# Patient Record
Sex: Male | Born: 1995
Health system: Southern US, Community
[De-identification: ages and names within clinical notes are randomized; demographics above are authoritative.]

## PROBLEM LIST (undated history)

## (undated) DIAGNOSIS — F952 Tourette's disorder: Secondary | ICD-10-CM

## (undated) DIAGNOSIS — F988 Other specified behavioral and emotional disorders with onset usually occurring in childhood and adolescence: Secondary | ICD-10-CM

---
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
## 2016-01-11 MED FILL — guanFACINE HCL 1 MG TABS: 1 | 90 days supply | Qty: 360 | Fill #1

## 2016-01-26 MED FILL — lamoTRIgine 100 MG TABS: 100 | 90 days supply | Qty: 135 | Fill #1

## 2016-03-21 MED FILL — AMANTADINE 100 MG TABLET: 100 | 90 days supply | Qty: 180 | Fill #2

## 2016-03-24 MED FILL — HYDROCODON-APAP 10-325: 10-325 | 3 days supply | Qty: 15 | Fill #0

## 2016-03-24 MED FILL — AMOXICILLIN 500 MG CAPSULE: 500 | 7 days supply | Qty: 21 | Fill #0

## 2016-03-28 MED FILL — PIMOZIDE 2 MG TABLET: 2 | 90 days supply | Qty: 180 | Fill #2

## 2016-04-11 MED FILL — guanFACINE HCL 1 MG TABS: 1 | 90 days supply | Qty: 360 | Fill #2

## 2016-04-29 MED FILL — lamoTRIgine 100 MG TABS: 100 | 90 days supply | Qty: 135 | Fill #2

## 2016-05-16 MED FILL — DEXMETHYLPHENIDATE ER 15 MG: 15 | 90 days supply | Qty: 90 | Fill #0

## 2016-06-17 MED FILL — PIMOZIDE 2 MG TABLET: 2 | 90 days supply | Qty: 180 | Fill #0

## 2016-06-17 MED FILL — AMANTADINE 100 MG TABLET: 100 | 90 days supply | Qty: 180 | Fill #0

## 2016-07-12 MED FILL — guanFACINE HCL 1 MG TABS: 1 | 90 days supply | Qty: 360 | Fill #0

## 2016-07-28 DIAGNOSIS — R4681 Obsessive-compulsive behavior: Secondary | ICD-10-CM | POA: Diagnosis not present

## 2016-07-28 DIAGNOSIS — F329 Major depressive disorder, single episode, unspecified: Secondary | ICD-10-CM | POA: Diagnosis not present

## 2016-07-28 DIAGNOSIS — F900 Attention-deficit hyperactivity disorder, predominantly inattentive type: Secondary | ICD-10-CM | POA: Diagnosis not present

## 2016-07-28 MED FILL — lamoTRIgine 100 MG TABS: 100 | 90 days supply | Qty: 135 | Fill #0

## 2016-08-12 MED FILL — DEXMETHYLPHENIDATE ER 15 MG: 15 | 90 days supply | Qty: 90 | Fill #0

## 2016-09-20 MED FILL — AMANTADINE 100 MG TABLET: 100 | 90 days supply | Qty: 180 | Fill #0

## 2016-09-27 MED FILL — PIMOZIDE 2 MG TABLET: 2 | 90 days supply | Qty: 180 | Fill #0

## 2016-10-10 MED FILL — guanFACINE HCL 1 MG TABS: 1 | 90 days supply | Qty: 360 | Fill #0

## 2016-10-24 MED FILL — lamoTRIgine 100 MG TABS: 100 | 90 days supply | Qty: 135 | Fill #1

## 2016-11-08 MED FILL — DEXMETHYLPHENIDATE ER 15 MG: 15 | 90 days supply | Qty: 90 | Fill #0

## 2016-12-19 MED FILL — AMANTADINE HCL 100 MG TAB: 100 | 90 days supply | Qty: 180 | Fill #1

## 2016-12-30 MED FILL — PIMOZIDE 2 MG TABLET: 2 | 90 days supply | Qty: 180 | Fill #1

## 2017-01-09 MED FILL — guanFACINE HCL 1 MG TABS: 1 | 90 days supply | Qty: 360 | Fill #1

## 2017-01-26 DIAGNOSIS — F329 Major depressive disorder, single episode, unspecified: Secondary | ICD-10-CM | POA: Diagnosis not present

## 2017-01-26 DIAGNOSIS — F902 Attention-deficit hyperactivity disorder, combined type: Secondary | ICD-10-CM | POA: Diagnosis not present

## 2017-01-26 DIAGNOSIS — R4681 Obsessive-compulsive behavior: Secondary | ICD-10-CM | POA: Diagnosis not present

## 2017-01-26 MED FILL — lamoTRIgine 100 MG TABS: 100 | 90 days supply | Qty: 135 | Fill #0

## 2017-02-09 MED FILL — DEXMETHYLPHENIDATE ER 15 MG: 15 | 90 days supply | Qty: 90 | Fill #0

## 2017-03-17 MED FILL — AMANTADINE HCL 100 MG TAB: 100 | 90 days supply | Qty: 180 | Fill #0

## 2017-03-27 MED FILL — PIMOZIDE 2 MG TABLET: 2 | 90 days supply | Qty: 180 | Fill #0

## 2017-04-10 MED FILL — guanFACINE HCL 1 MG TABS: 1 | 90 days supply | Qty: 360 | Fill #0

## 2017-04-17 MED FILL — lamoTRIgine 100 MG TABS: 100 | 90 days supply | Qty: 135 | Fill #1

## 2017-05-11 MED FILL — DEXMETHYLPHENIDATE ER 15 MG: 15 | 90 days supply | Qty: 90 | Fill #0

## 2017-06-15 MED FILL — AMANTADINE HCL 100 MG TAB: 100 | 90 days supply | Qty: 180 | Fill #1

## 2017-06-26 MED FILL — PIMOZIDE 2 MG TABLET: 2 | 90 days supply | Qty: 180 | Fill #1

## 2017-07-10 MED FILL — guanFACINE HCL 1 MG TABS: 1 | 90 days supply | Qty: 360 | Fill #1

## 2017-07-24 MED FILL — lamoTRIgine 100 MG TABS: 100 | 90 days supply | Qty: 135 | Fill #0

## 2017-08-14 MED FILL — DEXMETHYLPHENIDATE ER 15 MG: 15 | 90 days supply | Qty: 90 | Fill #0

## 2017-08-25 DIAGNOSIS — F900 Attention-deficit hyperactivity disorder, predominantly inattentive type: Secondary | ICD-10-CM | POA: Diagnosis not present

## 2017-08-25 DIAGNOSIS — R4681 Obsessive-compulsive behavior: Secondary | ICD-10-CM | POA: Diagnosis not present

## 2017-08-25 DIAGNOSIS — F329 Major depressive disorder, single episode, unspecified: Secondary | ICD-10-CM | POA: Diagnosis not present

## 2017-09-01 MED FILL — DEXMETHYLPHENIDATE 10 MG TA: 10 | 90 days supply | Qty: 90 | Fill #0

## 2017-09-18 MED FILL — AMANTADINE HCL 100 MG TAB: 100 | 90 days supply | Qty: 180 | Fill #0

## 2017-09-26 MED FILL — PIMOZIDE 2 MG TABLET: 2 | 90 days supply | Qty: 180 | Fill #0

## 2017-10-09 MED FILL — guanFACINE HCL 1 MG TABS: 1 | 90 days supply | Qty: 360 | Fill #0

## 2017-10-23 MED FILL — lamoTRIgine 100 MG TABS: 100 | 90 days supply | Qty: 180 | Fill #0

## 2017-11-10 MED FILL — DEXMETHYLPHENIDATE ER 15 MG: 15 | 90 days supply | Qty: 90 | Fill #0

## 2017-12-18 MED FILL — AMANTADINE HCL 100 MG TAB: 100 | 90 days supply | Qty: 180 | Fill #1

## 2018-01-01 MED FILL — PIMOZIDE 2 MG TABLET: 2 | 90 days supply | Qty: 180 | Fill #1

## 2018-01-09 MED FILL — guanFACINE HCL 1 MG TABS: 1 | 90 days supply | Qty: 360 | Fill #1

## 2018-02-09 MED FILL — DEXMETHYLPHENIDATE ER 15 MG: 15 | 30 days supply | Qty: 30 | Fill #0

## 2018-02-19 MED FILL — lamoTRIgine 100 MG TABS: 100 | 90 days supply | Qty: 180 | Fill #1

## 2018-02-21 DIAGNOSIS — F329 Major depressive disorder, single episode, unspecified: Secondary | ICD-10-CM | POA: Diagnosis not present

## 2018-02-21 DIAGNOSIS — F900 Attention-deficit hyperactivity disorder, predominantly inattentive type: Secondary | ICD-10-CM | POA: Diagnosis not present

## 2018-02-21 DIAGNOSIS — R4681 Obsessive-compulsive behavior: Secondary | ICD-10-CM | POA: Diagnosis not present

## 2018-02-21 MED FILL — DEXMETHYLPHENIDATE 10 MG TA: 10 | 30 days supply | Qty: 30 | Fill #0

## 2018-03-12 MED FILL — DEXMETHYLPHENIDATE ER 15 MG: 15 | 90 days supply | Qty: 90 | Fill #0

## 2018-03-19 MED FILL — AMANTADINE HCL 100 MG TAB: 100 | 30 days supply | Qty: 60 | Fill #0

## 2018-04-02 MED FILL — PIMOZIDE 2 MG TABLET: 2 | 30 days supply | Qty: 60 | Fill #0

## 2018-04-06 MED FILL — guanFACINE HCL 1 MG TABS: 1 | 30 days supply | Qty: 120 | Fill #0

## 2018-04-23 MED FILL — AMANTADINE HCL 100 MG TAB: 100 | 30 days supply | Qty: 60 | Fill #1

## 2018-04-30 MED FILL — PIMOZIDE 2 MG TABLET: 2 | 30 days supply | Qty: 60 | Fill #1

## 2018-05-10 MED FILL — guanFACINE HCL 1 MG TABS: 1 | 30 days supply | Qty: 120 | Fill #1

## 2018-05-14 DIAGNOSIS — R4681 Obsessive-compulsive behavior: Secondary | ICD-10-CM | POA: Diagnosis not present

## 2018-05-14 DIAGNOSIS — F329 Major depressive disorder, single episode, unspecified: Secondary | ICD-10-CM | POA: Diagnosis not present

## 2018-05-14 DIAGNOSIS — F900 Attention-deficit hyperactivity disorder, predominantly inattentive type: Secondary | ICD-10-CM | POA: Diagnosis not present

## 2018-05-14 MED FILL — DEXMETHYLPHENIDATE 5 MG TAB: 5 | 30 days supply | Qty: 60 | Fill #0

## 2018-05-14 MED FILL — lamoTRIgine 100 MG TABS: 100 | 90 days supply | Qty: 180 | Fill #0

## 2018-05-21 MED FILL — AMANTADINE HCL 100 MG TAB: 100 | 21 days supply | Qty: 30 | Fill #0

## 2018-06-04 MED FILL — guanFACINE HCL 1 MG TABS: 1 | 30 days supply | Qty: 120 | Fill #2

## 2018-06-04 MED FILL — PIMOZIDE 2 MG TABLET: 2 | 30 days supply | Qty: 60 | Fill #2

## 2018-06-08 MED FILL — DEXMETHYLPHENIDATE ER 15 MG: 15 | 90 days supply | Qty: 90 | Fill #0

## 2018-06-25 MED FILL — AMANTADINE HCL 100 MG TAB: 100 | 30 days supply | Qty: 60 | Fill #2

## 2018-06-28 MED FILL — PIMOZIDE 2 MG TABLET: 2 | 90 days supply | Qty: 180 | Fill #0

## 2018-07-09 MED FILL — guanFACINE HCL 1 MG TABS: 1 | 90 days supply | Qty: 360 | Fill #0

## 2018-08-21 DIAGNOSIS — F900 Attention-deficit hyperactivity disorder, predominantly inattentive type: Secondary | ICD-10-CM | POA: Diagnosis not present

## 2018-08-21 DIAGNOSIS — F329 Major depressive disorder, single episode, unspecified: Secondary | ICD-10-CM | POA: Diagnosis not present

## 2018-08-21 DIAGNOSIS — R4681 Obsessive-compulsive behavior: Secondary | ICD-10-CM | POA: Diagnosis not present

## 2018-09-05 MED FILL — AMANTADINE HCL 100 MG TAB: 100 | 45 days supply | Qty: 45 | Fill #0

## 2018-09-05 MED FILL — DEXMETHYLPHENIDATE ER 15 MG: 15 | 90 days supply | Qty: 90 | Fill #0

## 2018-10-01 MED FILL — PIMOZIDE 2 MG TABLET: 2 | 90 days supply | Qty: 180 | Fill #0

## 2018-10-08 MED FILL — guanFACINE HCL 1 MG TABS: 1 | 90 days supply | Qty: 360 | Fill #0

## 2018-10-15 MED FILL — lamoTRIgine 100 MG TABS: 100 | 90 days supply | Qty: 135 | Fill #0

## 2018-10-30 MED FILL — AMANTADINE HCL 100 MG TAB: 100 | 45 days supply | Qty: 45 | Fill #0

## 2018-11-19 DIAGNOSIS — F329 Major depressive disorder, single episode, unspecified: Secondary | ICD-10-CM | POA: Diagnosis not present

## 2018-11-19 DIAGNOSIS — R4681 Obsessive-compulsive behavior: Secondary | ICD-10-CM | POA: Diagnosis not present

## 2018-11-19 DIAGNOSIS — F900 Attention-deficit hyperactivity disorder, predominantly inattentive type: Secondary | ICD-10-CM | POA: Diagnosis not present

## 2018-11-20 MED FILL — DEXMETHYLPHENIDATE 5 MG TAB: 5 | 60 days supply | Qty: 120 | Fill #0

## 2018-12-05 MED FILL — DEXMETHYLPHENIDATE ER 15 MG: 15 | 90 days supply | Qty: 90 | Fill #0

## 2018-12-10 MED FILL — AMANTADINE HCL 100 MG TAB: 100 | 90 days supply | Qty: 90 | Fill #0

## 2018-12-18 DIAGNOSIS — M20012 Mallet finger of left finger(s): Secondary | ICD-10-CM | POA: Diagnosis not present

## 2018-12-18 DIAGNOSIS — M79645 Pain in left finger(s): Secondary | ICD-10-CM | POA: Diagnosis not present

## 2018-12-18 DIAGNOSIS — M25642 Stiffness of left hand, not elsewhere classified: Secondary | ICD-10-CM | POA: Diagnosis not present

## 2018-12-25 DIAGNOSIS — M20012 Mallet finger of left finger(s): Secondary | ICD-10-CM | POA: Diagnosis not present

## 2018-12-31 MED FILL — PIMOZIDE 2 MG TABLET: 2 | 90 days supply | Qty: 180 | Fill #0

## 2019-01-07 MED FILL — guanFACINE HCL 1 MG TABS: 1 | 90 days supply | Qty: 360 | Fill #0

## 2019-01-14 MED FILL — lamoTRIgine 100 MG TABS: 100 | 90 days supply | Qty: 135 | Fill #0

## 2019-02-12 DIAGNOSIS — M25642 Stiffness of left hand, not elsewhere classified: Secondary | ICD-10-CM | POA: Diagnosis not present

## 2019-02-12 DIAGNOSIS — M79645 Pain in left finger(s): Secondary | ICD-10-CM | POA: Diagnosis not present

## 2019-02-12 DIAGNOSIS — M20012 Mallet finger of left finger(s): Secondary | ICD-10-CM | POA: Diagnosis not present

## 2019-03-05 DIAGNOSIS — R4681 Obsessive-compulsive behavior: Secondary | ICD-10-CM | POA: Diagnosis not present

## 2019-03-05 DIAGNOSIS — F900 Attention-deficit hyperactivity disorder, predominantly inattentive type: Secondary | ICD-10-CM | POA: Diagnosis not present

## 2019-03-05 DIAGNOSIS — F329 Major depressive disorder, single episode, unspecified: Secondary | ICD-10-CM | POA: Diagnosis not present

## 2019-03-05 MED FILL — AMANTADINE HCL 100 MG TAB: 100 | 90 days supply | Qty: 90 | Fill #0

## 2019-03-06 MED FILL — DEXMETHYLPHENIDATE ER 15 MG: 15 | 90 days supply | Qty: 90 | Fill #0

## 2019-03-06 MED FILL — DEXMETHYLPHENIDATE 5 MG TAB: 5 | 60 days supply | Qty: 120 | Fill #0

## 2019-03-26 MED FILL — AMOXICILLIN 250 MG CAPSULE: 250 | 10 days supply | Qty: 40 | Fill #0

## 2019-04-03 MED FILL — PIMOZIDE 2 MG TABLET: 2 | 90 days supply | Qty: 180 | Fill #0

## 2019-04-10 MED FILL — guanFACINE HCL 1 MG TABS: 1 | 90 days supply | Qty: 360 | Fill #0

## 2019-04-18 MED FILL — lamoTRIgine 100 MG TABS: 100 | 90 days supply | Qty: 135 | Fill #0

## 2019-06-10 MED FILL — AMANTADINE HCL 100 MG TAB: 100 | 90 days supply | Qty: 90 | Fill #0

## 2019-06-11 MED FILL — DEXMETHYLPHENIDATE ER 15 MG: 15 | 90 days supply | Qty: 90 | Fill #0

## 2019-06-12 DIAGNOSIS — F329 Major depressive disorder, single episode, unspecified: Secondary | ICD-10-CM | POA: Diagnosis not present

## 2019-06-12 DIAGNOSIS — R4681 Obsessive-compulsive behavior: Secondary | ICD-10-CM | POA: Diagnosis not present

## 2019-06-12 DIAGNOSIS — F900 Attention-deficit hyperactivity disorder, predominantly inattentive type: Secondary | ICD-10-CM | POA: Diagnosis not present

## 2019-06-13 MED FILL — DEXMETHYLPHENIDATE 5 MG TAB: 5 | 60 days supply | Qty: 120 | Fill #0

## 2019-07-04 MED FILL — PIMOZIDE 2 MG TABLET: 2 | 90 days supply | Qty: 180 | Fill #0

## 2019-07-04 MED FILL — guanFACINE HCL 1 MG TABS: 1 | 90 days supply | Qty: 360 | Fill #0

## 2019-07-30 MED FILL — lamoTRIgine 100 MG TABS: 100 | 90 days supply | Qty: 135 | Fill #0

## 2019-09-06 MED FILL — AMANTADINE HCL 100 MG TAB: 100 | 90 days supply | Qty: 90 | Fill #0

## 2019-10-06 ENCOUNTER — Ambulatory Visit (HOSPITAL_COMMUNITY)
Admission: EM | Admit: 2019-10-06 | Discharge: 2019-10-06 | Disposition: A | Discharge status: Home / Self Care | Payer: 59 | Attending: Urgent Care | Admitting: Urgent Care

## 2019-10-06 ENCOUNTER — Other Ambulatory Visit: Payer: Self-pay

## 2019-10-06 ENCOUNTER — Encounter (HOSPITAL_COMMUNITY): Payer: Self-pay

## 2019-10-06 DIAGNOSIS — J358 Other chronic diseases of tonsils and adenoids: Secondary | ICD-10-CM | POA: Diagnosis not present

## 2019-10-06 DIAGNOSIS — J029 Acute pharyngitis, unspecified: Secondary | ICD-10-CM | POA: Insufficient documentation

## 2019-10-06 LAB — POCT RAPID STREP A: Streptococcus, Group A Screen (Direct): NEGATIVE

## 2019-10-06 MED ORDER — NAPROXEN 500 MG PO TABS: 500.0000 mg | ORAL_TABLET | Freq: Two times a day (BID) | ORAL | 0 refills | Status: AC

## 2019-10-06 MED ORDER — AMOXICILLIN 500 MG PO CAPS: 500.0000 mg | ORAL_CAPSULE | Freq: Two times a day (BID) | ORAL | 0 refills | Status: AC

## 2019-10-06 NOTE — ED Provider Notes (Signed)
[facility]   MRN: [id number] DOB: 01/04/1996  Subjective:   Stanley Williamson is a 24 y.o. male presenting for 3-day history of acute onset persistent and worsening throat pain with difficulty swallowing. Had COVID 19 testing about 10 days ago and was negative.   No current facility-administered medications for this encounter.  Current Outpatient Medications:  .  amantadine (SYMMETREL) 100 MG capsule, Take 1 capsule (100 mg total) by mouth 2 (two) times daily., Disp: 180 capsule, Rfl: 1 .  dexmethylphenidate (FOCALIN XR) 15 MG 24 hr capsule, Take 1 capsule (15 mg total) by mouth daily., Disp: 90 capsule, Rfl: 0 .  dexmethylphenidate (FOCALIN) 5 MG tablet, Take 1 tablet (5 mg total) by mouth 2 (two) times daily., Disp: 180 tablet, Rfl: 0 .  guanFACINE (TENEX) 1 MG tablet, Take one tablet qam and 2 qpm, Disp: 270 tablet, Rfl: 1 .  lamoTRIgine (LAMICTAL) 100 MG tablet, Take 100 mg by mouth in the morning and at bedtime., Disp: , Rfl:  .  pimozide (ORAP) 2 MG tablet, Take by mouth 2 (two) times daily. Take .5 tab qam and one tab qpm, Disp: , Rfl:  .  buPROPion (WELLBUTRIN XL) 300 MG 24 hr tablet, TAKE 1 TABLET BY MOUTH DAILY.  "OV NEEDED FOR ADDITIONAL REFILLS", Disp: 30 tablet, Rfl: 0 .  citalopram (CELEXA) 10 MG tablet, Take 1 tablet (10 mg total) by mouth daily., Disp: 30 tablet, Rfl: 1   No Known Allergies  Past Medical History:  Diagnosis Date  . Attention deficit disorder   . Tourette's      History reviewed. No pertinent surgical history.  Family History  Problem Relation Age of Onset  . Migraines Maternal Aunt   . Migraines Maternal Grandfather   . Dementia Paternal Grandmother   . Diabetes Other     Social History   Tobacco Use  . Smoking status: Never Smoker  . Smokeless tobacco: Never Used  Substance Use Topics  . Alcohol use: No  . Drug use: No    Review of Systems  Constitutional: Negative for fever and malaise/fatigue.  HENT: Positive for sore  throat. Negative for congestion, ear pain and sinus pain.   Eyes: Negative for discharge and redness.  Respiratory: Negative for cough, hemoptysis, shortness of breath and wheezing.   Cardiovascular: Negative for chest pain.  Gastrointestinal: Negative for abdominal pain, diarrhea, nausea and vomiting.  Genitourinary: Negative for dysuria, flank pain and hematuria.  Musculoskeletal: Negative for myalgias.  Skin: Negative for rash.  Neurological: Negative for dizziness, weakness and headaches.  Psychiatric/Behavioral: Negative for depression and substance abuse.     Objective:   Vitals: BP 119/80 (BP Location: Right Arm)   Pulse (!) 108 Comment: pt drank energy drink this am  Temp 98.2 F (36.8 C) (Oral)   Resp 16   SpO2 99%   Physical Exam Constitutional:      General: He is not in acute distress.    Appearance: Normal appearance. He is well-developed and normal weight. He is not ill-appearing, toxic-appearing or diaphoretic.  HENT:     Head: Normocephalic and atraumatic.     Right Ear: External ear normal.     Left Ear: External ear normal.     Nose: Nose normal.     Mouth/Throat:     Pharynx: Pharyngeal swelling, oropharyngeal exudate (R>L) and posterior oropharyngeal erythema present. No uvula swelling.     Tonsils: Tonsillar exudate present. No tonsillar abscesses. 2+ on the right. 1+ on the left.  Eyes:     General: No scleral icterus.       Right eye: No discharge.        Left eye: No discharge.     Extraocular Movements: Extraocular movements intact.     Pupils: Pupils are equal, round, and reactive to light.  Cardiovascular:     Rate and Rhythm: Normal rate.  Pulmonary:     Effort: Pulmonary effort is normal.  Musculoskeletal:     Cervical back: Normal range of motion.  Neurological:     Mental Status: He is alert and oriented to person, place, and time.  Psychiatric:        Mood and Affect: Mood normal.        Behavior: Behavior normal.        Thought  Content: Thought content normal.        Judgment: Judgment normal.     Results for orders placed or performed during the hospital encounter of 10/06/19 (from the past 24 hour(s))  POCT rapid strep A [facility])     Status: None   Collection Time: 10/06/19 10:59 AM  Result Value Ref Range   Streptococcus, Group A Screen (Direct) NEGATIVE NEGATIVE    Assessment and Plan :   PDMP not reviewed this encounter.  1. Pharyngitis, unspecified etiology   2. Tonsillar exudate     Will treat empirically for pharyngitis given physical exam findings.  Throat culture is pending.  Patient is to start amoxicillin, use supportive care otherwise. Counseled patient on potential for adverse effects with medications prescribed/recommended today, ER and return-to-clinic precautions discussed, patient verbalized understanding.    Jaynee Eagles, [place] 10/06/19 1151

## 2019-10-06 NOTE — ED Triage Notes (Signed)
See previous note in ED narrator.

## 2019-10-06 NOTE — ED Notes (Signed)
Pt presents today with sore throat x5 days. Pt denies fever, nausea, vomiting, congestion, runny nose, chills, loss of taste or smell. Pt states he has seasonal allergies, usually in spring time. Pt states he has been taking motrin with minimal relief, tylenol cold and flu with no relief.

## 2019-10-07 MED FILL — NAPROXEN 500 MG TABLET: 500 | 15 days supply | Qty: 30 | Fill #0

## 2019-10-07 MED FILL — AMOXICILLIN 500 MG CAPSULE: 500 | 10 days supply | Qty: 20 | Fill #0

## 2019-10-08 LAB — CULTURE, GROUP A STREP (THRC)

## 2019-10-09 DIAGNOSIS — F900 Attention-deficit hyperactivity disorder, predominantly inattentive type: Secondary | ICD-10-CM | POA: Diagnosis not present

## 2019-10-09 DIAGNOSIS — R4681 Obsessive-compulsive behavior: Secondary | ICD-10-CM | POA: Diagnosis not present

## 2019-10-09 DIAGNOSIS — F329 Major depressive disorder, single episode, unspecified: Secondary | ICD-10-CM | POA: Diagnosis not present

## 2019-10-09 MED FILL — PIMOZIDE 2 MG TABLET: 2 | 90 days supply | Qty: 180 | Fill #0

## 2019-10-09 MED FILL — guanFACINE HCL 1 MG TABS: 1 | 90 days supply | Qty: 360 | Fill #0

## 2019-10-10 MED FILL — DEXMETHYLPHENIDATE ER 15 MG: 15 | 90 days supply | Qty: 90 | Fill #0

## 2019-10-10 MED FILL — DEXMETHYLPHENIDATE 5 MG TAB: 5 | 60 days supply | Qty: 120 | Fill #0

## 2019-10-24 MED FILL — lamoTRIgine 100 MG TABS: 100 | 90 days supply | Qty: 135 | Fill #0

## 2019-10-30 DIAGNOSIS — R05 Cough: Secondary | ICD-10-CM | POA: Diagnosis not present

## 2019-10-31 DIAGNOSIS — Z20822 Contact with and (suspected) exposure to covid-19: Secondary | ICD-10-CM | POA: Diagnosis not present

## 2019-11-08 DIAGNOSIS — Z20822 Contact with and (suspected) exposure to covid-19: Secondary | ICD-10-CM | POA: Diagnosis not present

## 2019-11-08 DIAGNOSIS — Z03818 Encounter for observation for suspected exposure to other biological agents ruled out: Secondary | ICD-10-CM | POA: Diagnosis not present

## 2019-12-25 ENCOUNTER — Other Ambulatory Visit (HOSPITAL_COMMUNITY): Payer: Self-pay | Admitting: Psychiatric/Mental Health

## 2019-12-25 MED FILL — AMANTADINE HCL 100 MG TAB: 100 | 30 days supply | Qty: 30 | Fill #0

## 2020-01-03 MED FILL — guanFACINE HCL 1 MG TABS: 1 | 30 days supply | Qty: 120 | Fill #0

## 2020-01-03 MED FILL — PIMOZIDE 2 MG TABLET: 2 | 30 days supply | Qty: 60 | Fill #0

## 2020-01-06 MED FILL — DEXMETHYLPHENIDATE HCL ER 1: 15 | 30 days supply | Qty: 30 | Fill #0

## 2020-02-10 MED FILL — lamoTRIgine 100 MG TABS: 100 | 30 days supply | Qty: 45 | Fill #0

## 2020-02-10 MED FILL — guanFACINE HCL 1 MG TABS: 1 | 30 days supply | Qty: 120 | Fill #1

## 2020-02-10 MED FILL — PIMOZIDE 2 MG TABLET: 2 | 30 days supply | Qty: 60 | Fill #1

## 2020-02-10 MED FILL — DEXMETHYLPHENIDATE HCL ER 1: 15 | 30 days supply | Qty: 30 | Fill #1

## 2020-02-11 ENCOUNTER — Other Ambulatory Visit (HOSPITAL_COMMUNITY): Payer: Self-pay | Admitting: Psychiatric/Mental Health

## 2020-03-12 MED FILL — guanFACINE HCL 1 MG TABS: 1 | 30 days supply | Qty: 120 | Fill #2

## 2020-03-12 MED FILL — PIMOZIDE 2 MG TABLET: 2 | 30 days supply | Qty: 60 | Fill #2

## 2020-03-12 MED FILL — DEXMETHYLPHENIDATE HCL ER 1: 15 | 30 days supply | Qty: 30 | Fill #0

## 2020-03-12 MED FILL — lamoTRIgine 100 MG TABS: 100 | 30 days supply | Qty: 45 | Fill #1

## 2020-09-08 ENCOUNTER — Other Ambulatory Visit (HOSPITAL_COMMUNITY): Payer: Self-pay

## 2021-06-01 ENCOUNTER — Ambulatory Visit (HOSPITAL_COMMUNITY)
Admission: EM | Admit: 2021-06-01 | Discharge: 2021-06-01 | Discharge status: Against Medical Advice | Payer: Managed Care, Other (non HMO)

## 2021-06-01 ENCOUNTER — Other Ambulatory Visit: Payer: Self-pay

## 2021-06-01 NOTE — ED Notes (Signed)
No answer x2 

## 2021-07-05 ENCOUNTER — Other Ambulatory Visit (HOSPITAL_BASED_OUTPATIENT_CLINIC_OR_DEPARTMENT_OTHER): Payer: Self-pay

## 2022-11-28 ENCOUNTER — Ambulatory Visit (HOSPITAL_BASED_OUTPATIENT_CLINIC_OR_DEPARTMENT_OTHER): Payer: Managed Care, Other (non HMO) | Admitting: Family Medicine

## 2022-12-21 ENCOUNTER — Encounter (HOSPITAL_BASED_OUTPATIENT_CLINIC_OR_DEPARTMENT_OTHER): Payer: Managed Care, Other (non HMO) | Admitting: Family Medicine

## 2023-11-29 ENCOUNTER — Ambulatory Visit (INDEPENDENT_AMBULATORY_CARE_PROVIDER_SITE_OTHER): Payer: Self-pay | Admitting: Family Medicine

## 2023-11-29 VITALS — BP 100/60 | Ht 72.0 in | Wt 190.0 lb

## 2023-11-29 DIAGNOSIS — F4325 Adjustment disorder with mixed disturbance of emotions and conduct: Secondary | ICD-10-CM

## 2023-11-30 NOTE — Progress Notes (Signed)
 Patient came in today to have EKG to ensure normal QT interval given his current medications from [facility].  QTc normal at 426 - uploaded into chart.

## 2024-04-09 ENCOUNTER — Other Ambulatory Visit (HOSPITAL_COMMUNITY): Payer: Self-pay

## 2024-04-09 MED ORDER — DEXMETHYLPHENIDATE HCL ER 20 MG PO CP24
20.0000 mg | ORAL_CAPSULE | Freq: Every morning | ORAL | 0 refills | Status: DC
  Filled 2024-04-09: qty 30, 30d supply, fill #0

## 2024-04-10 ENCOUNTER — Other Ambulatory Visit: Payer: Self-pay

## 2024-04-10 ENCOUNTER — Other Ambulatory Visit (HOSPITAL_COMMUNITY): Payer: Self-pay

## 2024-04-15 ENCOUNTER — Other Ambulatory Visit (HOSPITAL_COMMUNITY): Payer: Self-pay

## 2024-04-15 ENCOUNTER — Other Ambulatory Visit: Payer: Self-pay

## 2024-04-15 MED ORDER — PIMOZIDE 2 MG PO TABS
2.0000 mg | ORAL_TABLET | Freq: Two times a day (BID) | ORAL | 1 refills | Status: AC
  Filled 2024-04-15: qty 180, 90d supply, fill #0

## 2024-04-15 MED ORDER — LAMOTRIGINE 100 MG PO TABS
ORAL_TABLET | ORAL | 1 refills | Status: AC
  Filled 2024-04-15: qty 135, 90d supply, fill #0

## 2024-04-15 MED ORDER — GUANFACINE HCL 1 MG PO TABS
2.0000 mg | ORAL_TABLET | Freq: Two times a day (BID) | ORAL | 1 refills | Status: AC
  Filled 2024-04-15: qty 360, 90d supply, fill #0

## 2024-04-15 MED ORDER — DULOXETINE HCL 20 MG PO CPEP
40.0000 mg | ORAL_CAPSULE | Freq: Every day | ORAL | 1 refills | Status: AC
  Filled 2024-04-15: qty 180, 90d supply, fill #0

## 2024-04-22 ENCOUNTER — Other Ambulatory Visit (HOSPITAL_BASED_OUTPATIENT_CLINIC_OR_DEPARTMENT_OTHER): Payer: Self-pay

## 2024-04-25 ENCOUNTER — Other Ambulatory Visit (HOSPITAL_COMMUNITY): Payer: Self-pay

## 2024-05-09 ENCOUNTER — Other Ambulatory Visit (HOSPITAL_COMMUNITY): Payer: Self-pay

## 2024-05-14 ENCOUNTER — Other Ambulatory Visit (HOSPITAL_COMMUNITY): Payer: Self-pay

## 2024-05-14 MED ORDER — DEXMETHYLPHENIDATE HCL ER 20 MG PO CP24: 20.0000 mg | ORAL_CAPSULE | Freq: Every morning | ORAL | 0 refills | Status: AC
